# Patient Record
Sex: Female | Born: 1960 | Race: White | Hispanic: No | Smoking: Never smoker
Health system: Southern US, Community
[De-identification: ages and names within clinical notes are randomized; demographics above are authoritative.]

## PROBLEM LIST (undated history)

## (undated) DIAGNOSIS — E041 Nontoxic single thyroid nodule: Secondary | ICD-10-CM

## (undated) DIAGNOSIS — D34 Benign neoplasm of thyroid gland: Secondary | ICD-10-CM

## (undated) DIAGNOSIS — K8020 Calculus of gallbladder without cholecystitis without obstruction: Secondary | ICD-10-CM

## (undated) DIAGNOSIS — E049 Nontoxic goiter, unspecified: Secondary | ICD-10-CM

## (undated) HISTORY — PX: OSTEOTOMY MAXILLARY: SUR982

## (undated) HISTORY — PX: KNEE ARTHROSCOPY W/ MENISCECTOMY: SHX1879

---

## 2010-03-18 MED ORDER — BUPROPION SR 150 MG TAB
150 mg | ORAL_TABLET | Freq: Two times a day (BID) | ORAL | Status: DC
Start: 2010-03-18 — End: 2016-12-04

## 2010-03-18 MED ORDER — OMEPRAZOLE 20 MG TAB, DELAYED RELEASE
20 mg | ORAL_TABLET | Freq: Every day | ORAL | Status: AC
Start: 2010-03-18 — End: ?

## 2010-03-18 MED ORDER — ATENOLOL 25 MG TAB
25 mg | ORAL_TABLET | Freq: Every day | ORAL | Status: DC
Start: 2010-03-18 — End: 2010-12-16

## 2010-03-18 NOTE — Progress Notes (Signed)
Suzanne Wong is a68 y.o.  female who presents to establish care.     Subjective: Perimenopause     Seen by Gynecologist recently, complains of irritable moods and decreased sex drive. Has tried Wellbutrin for depression which helped, Zoloft did not help recently.     Subjective: HTN   Patient presents for follow up of hypertension. No previous diagnosis or treatment.    Diet and Lifestyle: generally follows a low fat low cholesterol diet, exercises sporadically  Home BP Monitoring: is not measured at home    Cardiovascular ROS: no TIA's, no chest pain on exertion, no dyspnea on exertion, no swelling of ankles.      Subjective: chest pain    States frequent, occasionally awakens from sleep. Midchest radiates through toward back.    Subjective: Back Pain   Patient presents for presents evaluation of low back problems. States worse on left lower lumbar region, occasional nodule present that can be very painful, no concern for neuro deficit/cauda equina.    Subjective: GERD   Patient complains of gastroesophageal reflux with abdominal bloating and chest pain. Symptoms have been present for several months.  She denies dysphagia.  She  has not lost weight.  She denies melena, hematochezia, hematemesis, and coffee ground emesis. Medical therapy in the past has included OTC H2 antagonists.         Review of Systems - Pertinent items are noted in HPI.    History   Social History   ??? Marital Status: Unknown     Spouse Name: N/A     Number of Children: N/A   ??? Years of Education: N/A   Occupational History   ??? Not on file.   Social History Main Topics   ??? Smoking status: Never Smoker    ??? Smokeless tobacco: Never Used   ??? Alcohol Use: 10.0 oz/week     20 drink(s) per week      20 drinks /wk   ??? Drug Use: No   ??? Sexually Active: Not on file   Other Topics Concern   ??? Blood Transfusions No   Social History Narrative   ??? No narrative on file           Objective:    BP 139/97   Pulse 77   Temp(Src) 98.1 ??F (36.7 ??C) (Oral)   Resp 12   Ht 5\' 4"  (1.626 m)   Wt 128 lb (58.06 kg)   BMI 21.97 kg/m2    Physical Examination:   General appearance - alert, well appearing, and in no distress  Mental status - alert, oriented to person, place, and time, normal mood, behavior, speech, dress, motor activity, and thought processes  Eyes - pupils equal and reactive, extraocular eye movements intact  Ears - bilateral TM's and external ear canals normal  Nose - normal and patent, no erythema, discharge or polyps  Mouth - mucous membranes moist, pharynx normal without lesions  Neck - supple, no significant adenopathy, no thyromegaly  Chest - clear to auscultation, no wheezes, rales or rhonchi, symmetric air entry  Heart - normal rate, regular rhythm, normal S1, S2, no murmurs, rubs, clicks or gallops  Abdomen - soft, nontender, nondistended, no masses or organomegaly, bowel sounds normal and present  Neurological - alert, oriented, normal speech, no focal findings or movement disorder noted, cranial nerves II through XII intact, DTR's normal and symmetric, motor and sensory grossly normal bilaterally  Musculoskeletal - no joint tenderness, deformity or swelling  Extremities - peripheral  pulses normal, no pedal edema, no clubbing or cyanosis  Skin - normal coloration and turgor, no rashes, no suspicious skin lesions noted       Assessment / Plan   Suzanne was seen today for establish care.    Diagnoses and associated orders for this visit:    Htn (hypertension)  - atenolol (TENORMIN) 25 mg tablet; Take 1 Tab by mouth daily.    Gerd (gastroesophageal reflux disease)  - Omeprazole delayed release (PRILOSEC D/R) 20 mg tablet; Take 1 Tab by mouth daily.    Perimenopause  - buPROPion SR (WELLBUTRIN SR) 150 mg SR tablet; Take 1 Tab by mouth two (2) times a day.    Chest pain  - NM CARDIAC SPECT W STRESS / REST SNGL; Future    Screening mammogram  - MAM MAMMOGRAM SCREENING BILATERAL; Future     Other Orders  - Discontinue: ranitidine hcl 150 mg capsule; Take 150 mg by mouth two (2) times a day.        Medication(s) were discussed with patient including risk/benefit and common side effects. Patient in agreement with plan.    Handout provided to patient regarding GERD, chest pain.  Follow-up Disposition:  Return in about 1 month (around 04/18/2010).    Hewitt Shorts, MD

## 2010-03-18 NOTE — Patient Instructions (Addendum)
MyChart Activation    Thank you for requesting access to MyChart. Please follow the instructions below to securely access and download your online medical record. MyChart allows you to send messages to your doctor, view your test results, renew your prescriptions, schedule appointments, and more.    How Do I Sign Up?    1. In your internet browser, go to [email or web address].  2. Click on the First Time User? Click Here link in the Sign In box. You will see the New Member Sign Up page.  3. Enter your MyChart Access Code exactly as it appears below. You will not need to use this code after you???ve completed the sign-up process. If you do not sign up before the expiration date, you must request a new code.    MyChart Access Code: [email or web address]  Expires: 06/16/10 03:13 PM     4. Enter the last four digits of your Social Security Number (xxxx) and Date of Birth (mm/dd/yyyy) as indicated and click Submit. You will be taken to the next sign-up page.  5. Create a MyChart ID. This will be your MyChart login ID and cannot be changed, so think of one that is secure and easy to remember.  6. Create a MyChart password. You can change your password at any time.  7. Enter your Password Reset Question and Answer. This can be used at a later time if you forget your password.   8. Enter your e-mail address. You will receive e-mail notification when new information is available in MyChart.  9. Click Sign Up. You can now view and download portions of your medical record.  10. Click the Download Summary menu link to download a portable copy of your medical information.    Additional Information    If you have questions, please call [phone]. Remember, MyChart is NOT to be used for urgent needs. For medical emergencies, dial 911.      English   Spanish  Chest Pain: After Your Visit to the Emergency Room  Your Care Instructions     There are many things that can cause chest pain. Some are not serious and will get better on their own in a few days. But some kinds of chest pain need more testing and treatment. Your doctor may have recommended a follow-up visit in the next 8 to 12 hours. If you are not getting better, you may need more tests or treatment.  Even though you have been released from the emergency room, you still need to watch for any problems. The doctor carefully checked you. But sometimes problems can develop later. If you have new symptoms, or if your symptoms do not get better, return to the emergency room or call your doctor right away.  If you have worse or different chest pain or pressure that lasts more than 5 minutes or you passed out (llost consciousness), call 911 or seek other emergency help right away.   A visit to the emergency room is only one step in your treatment. Even if you feel better, you still need to do what your doctor recommends, such as going to all suggested follow-up appointments and taking medicines exactly as directed. This will help you recover and help prevent future problems.  How can you care for yourself at home?  ?? Rest until you feel better.   ?? Take your medicine exactly as prescribed. Call your doctor if you think you are having a problem with your medicine.   ?? Do not drive after  taking a prescription pain medicine.  When should you call for help?  Call 911 if:  ?? You passed out (lost consciousness).   ?? You have sudden shortness of breath with chest pain, or you cough up blood.   ?? You have worse or different chest pain or pressure that lasts more than 5 minutes, along with:   ?? Sweating.   ?? Shortness of breath.   ?? Nausea or vomiting.   ?? Pain that spreads from the chest to the neck, jaw, or one or both shoulders or arms.   ?? Dizziness or lightheadedness.   ?? A fast, slow, or uneven pulse.   After calling 911, chew one adult-strength aspirin. If an ambulance is not an option, have someone drive you to the hospital. Do NOT try to drive yourself.   ?? You have other symptoms that you think are a medical emergency.  Return to the emergency room now if:  ?? You have any trouble breathing.   ?? Your chest pain gets worse.   ?? You are dizzy or lightheaded, or you feel like you may faint.  Call your doctor today if:  ?? Your chest pain does not get better.   ?? Your chest pain happens when you exercise and goes away when you rest.     Where can you learn more?   Go to [email or web address]  Enter A120 in the search box to learn more about "Chest Pain: After Your Visit to the Emergency Room."   ?? 2006-2011 [employer]. Care instructions adapted under license by [employer] (which disclaims liability or warranty for this information). This care instruction is for use with your licensed healthcare professional. If you have questions about a medical condition or this instruction, always ask your healthcare professional. [employer], [employer] disclaims any warranty or liability for your use of this information.  Content Version: 8.9.83828; Last Revised: October 13, 2010English   Spanish  Gastroesophageal Reflux Disease (GERD): After Your Visit  Your Care Instructions    Gastroesophageal reflux disease (GERD) is the backward flow of stomach acid into the esophagus. The esophagus is the tube that leads from your throat to your stomach. A one-way valve prevents the stomach acid from moving up into this tube. When you have GERD, this valve does not close tightly enough.  If you have mild GERD symptoms including heartburn, you may be able to control the problem with antacids or over-the-counter medicine. Changing your diet, losing weight, and making other lifestyle changes can also help reduce symptoms.   Follow-up care is a key part of your treatment and safety. Be sure to make and go to all appointments, and call your doctor if you are having problems. It???s also a good idea to know your test results and keep a list of the medicines you take.  How can you care for yourself at home?  ?? Take your medicines exactly as prescribed. Call your doctor if you think you are having a problem with your medicine.   ?? Do not take aspirin or other nonsteroidal anti-inflammatory drugs (NSAIDs), such as ibuprofen (Advil, Motrin) or naproxen (Aleve), unless your doctor says it is okay. Ask your doctor what you can take for pain.   ?? Your doctor may recommend over-the-counter medicine. For mild or occasional indigestion, antacids, such as Tums, Gaviscon, Mylanta, or Maalox, may help. Your doctor also may recommend over-the-counter acid reducers, such as Pepcid AC, Tagamet HB, Zantac 75, or Prilosec. Read and follow all instructions on  the label. If you use these medicines often, talk with your doctor.   ?? Do not smoke or chew tobacco. Smoking can make GERD worse. If you need help quitting, talk to your doctor about stop-smoking programs and medicines. These can increase your chances of quitting for good.   ?? Do not drink coffee or alcohol, and do not eat any foods that make you feel worse. These may include fatty foods, chocolate, citrus, tomatoes, peppermint, and spicy foods.   ?? Eat smaller meals, more often. After eating, wait 2 to 3 hours before you lie down.   ?? Do not wear tight clothing around your midsection.   ?? If you are overweight, lose weight. Even losing 5 to 10 pounds can help.   ?? Raise the head of your bed 6 to 8 inches by putting blocks under the frame or a foam wedge under the head of the mattress.  When should you call for help?  Call 911 anytime you think you may need emergency care. For example, call if:  ?? You have chest pain or pressure. This may occur with:   ?? Sweating.   ?? Shortness of breath.    ?? Nausea or vomiting.   ?? Pain that spreads from the chest to the neck, jaw, or one or both shoulders or arms.   ?? Dizziness or lightheadedness.   ?? A fast or uneven pulse.  After calling 911, chew 1 adult-strength aspirin. Wait for an ambulance. Do not try to drive yourself.   ?? You vomit blood or what looks like coffee grounds.   ?? You pass maroon or very bloody stools.  Call your doctor now or seek immediate medical care if:  ?? You have new or different belly pain.   ?? Your stools are black and tarlike or have streaks of blood.  Watch closely for changes in your health, and be sure to contact your doctor if:  ?? Your symptoms have not improved after 2 days.   ?? Food seems to catch in your throat or chest.     Where can you learn more?   Go to [email or web address]  Enter [phone] in the search box to learn more about "Gastroesophageal Reflux Disease (GERD): After Your Visit."   ?? 2006-2011 [employer]. Care instructions adapted under license by [employer] (which disclaims liability or warranty for this information). This care instruction is for use with your licensed healthcare professional. If you have questions about a medical condition or this instruction, always ask your healthcare professional. [employer], [employer] disclaims any warranty or liability for your use of this information.  Content Version: 8.9.83828; Last Revised: March 16, 2009English   Spanish  High Blood Pressure: After Your Visit  Your Care Instructions  If your blood pressure is usually above 140/90, you have high blood pressure, or hypertension. Despite what a lot of people think, high blood pressure usually does not cause headaches or make you feel dizzy or lightheaded. It usually has no symptoms. But it does increase your risk for heart attack, stroke, and kidney or eye damage. The higher your blood pressure, the more your risk increases.   Changes in your lifestyle, such as staying at a healthy weight, may help you lower your blood pressure. Your treatment also will include medicine. If you stop taking your medicine, your blood pressure will go back up.  Follow-up care is a key part of your treatment and safety. Be sure to make and go to all appointments, and  call your doctor if you are having problems. It???s also a good idea to know your test results and keep a list of the medicines you take.  How can you care for yourself at home?  Medical treatment  ?? Take your medicine exactly as prescribed. You may take one or more types of medicine to lower your blood pressure. They include diuretics, beta-blockers, ACE inhibitors, calcium channel blockers, angiotensin II receptor blockers, and other medicines. Call your doctor if you think you are having a problem with your medicine.   ?? Your doctor may suggest that you take one low-dose aspirin (81 mg) a day. This can help reduce your risk of having a stroke or heart attack.   ?? See your doctor at least 2 times a year. You may need to see the doctor more often at first or until your blood pressure comes down.   ?? If you are taking blood pressure medicine, talk to your doctor before you take decongestants or anti-inflammatory medicine, such as ibuprofen. Some of these medicines can raise blood pressure.   ?? Learn how to check your blood pressure at home.  Lifestyle changes  ?? Stay at a healthy weight. This is especially important if you put on weight around the waist. Losing even 10 pounds can help you lower your blood pressure.   ?? If your doctor recommends it, get more exercise. Walking is a good choice. Bit by bit, increase the amount you walk every day. Try for at least 30 minutes on most days of the week. You also may want to swim, bike, or do other activities.   ?? Avoid or limit alcohol. Talk to your doctor about whether you can drink any alcohol.   ?? Limit salt.    ?? Eat plenty of fruits (such as bananas and oranges), vegetables, legumes, whole grains, and low-fat dairy products.   ?? Lower the amount of saturated fat in your diet. Saturated fat is found in animal products such as milk, cheese, and meat. Limiting these foods may help you lose weight and also lower your risk for heart disease.   ?? Do not smoke. Smoking increases your risk for heart attack and stroke. If you need help quitting, talk to your doctor about stop-smoking programs and medicines. These can increase your chances of quitting for good.  When should you call for help?  Call 911 anytime you think you may need emergency care. For example, call if:  ?? You have chest pain or pressure. This may occur with:   ?? Sweating.   ?? Shortness of breath.   ?? Nausea or vomiting.   ?? Pain that spreads from the chest to the neck, jaw, or one or both shoulders or arms.   ?? Dizziness or lightheadedness.   ?? A fast or uneven pulse.  After calling 911, chew 1 adult-strength aspirin. Wait for an ambulance. Do not try to drive yourself.   ?? You have signs of a stroke. These include:   ?? Sudden numbness, paralysis, or weakness in your face, arm, or leg, especially on only one side of your body.   ?? New problems with walking or balance.   ?? Sudden vision changes.   ?? New problems speaking or understanding simple statements, or feeling confused.   ?? A sudden, severe headache that is different from past headaches.  Call your doctor now or seek immediate medical care if:  ?? Your blood pressure rises suddenly.   ?? Your blood pressure  is 180/110 or higher.   ?? You are dizzy or lightheaded, or you feel like you may faint.   ?? Your blood pressure is higher than 140/90 on two or more occasions.  Watch closely for changes in your health, and be sure to contact your doctor if:  ?? You do not get better as expected.     Where can you learn more?   Go to [email or web address]   Enter [phone] in the search box to learn more about "High Blood Pressure: After Your Visit."   ?? 2006-2011 [employer], [employer]. Care instructions adapted under license by [employer] (which disclaims liability or warranty for this information). This care instruction is for use with your licensed healthcare professional. If you have questions about a medical condition or this instruction, always ask your healthcare professional. [employer], [employer] disclaims any warranty or liability for your use of this information.  Content Version: 8.9.83828; Last Revised: Dec 24, 2007

## 2010-03-20 LAB — CBC WITH AUTOMATED DIFF
ABS. BASOPHILS: 0 10*3/uL (ref 0.0–0.1)
ABS. EOSINOPHILS: 0.2 10*3/uL (ref 0.0–0.5)
ABS. LYMPHOCYTES: 1.7 10*3/uL (ref 0.8–3.5)
ABS. MONOCYTES: 0.5 10*3/uL — ABNORMAL LOW (ref 0.8–3.5)
ABS. NEUTROPHILS: 2.1 10*3/uL (ref 1.5–8.0)
BASOPHILS: 1 % (ref 0–2)
EOSINOPHILS: 5 % (ref 0–5)
HCT: 42.2 % (ref 41–53)
HGB: 14.5 g/dL (ref 12.0–16.0)
LYMPHOCYTES: 38 % (ref 19–48)
MCH: 33.3 PG — ABNORMAL HIGH (ref 27–31)
MCHC: 34.4 g/dL (ref 31–37)
MCV: 96.8 FL (ref 80–100)
MONOCYTES: 11 % — ABNORMAL HIGH (ref 3–9)
MPV: 8.8 FL (ref 5.9–10.3)
NEUTROPHILS: 45 % (ref 40–74)
PLATELET: 254 10*3/uL (ref 130–400)
RBC: 4.36 M/uL (ref 4.2–5.4)
RDW: 12.9 % (ref 11.5–14.5)
WBC: 4.6 10*3/uL (ref 4.5–10.8)

## 2010-03-20 LAB — METABOLIC PANEL, BASIC
Anion gap: 10 mmol/L (ref 6–15)
BUN/Creatinine ratio: 11 (ref 7–25)
BUN: 9 MG/DL (ref 7–18)
CO2: 26 MMOL/L (ref 21–32)
Calcium: 9.8 MG/DL (ref 8.5–10.1)
Chloride: 101 MMOL/L (ref 98–107)
Creatinine: 0.8 MG/DL (ref 0.6–1.3)
GFR est AA: >60 mL/min/{1.73_m2} (ref >60)
GFR est non-AA: >60 mL/min/{1.73_m2} (ref >60)
Glucose: 88 MG/DL (ref 70–110)
Potassium: 4.2 MMOL/L (ref 3.5–5.1)
Sodium: 137 MMOL/L (ref 136–145)

## 2010-03-20 LAB — HEPATIC FUNCTION PANEL
A-G Ratio: 1.4 (ref 1.2–2.2)
ALT (SGPT): 30 U/L (ref 30–65)
AST (SGOT): 16 U/L (ref 15–37)
Albumin: 4.5 g/dL (ref 3.4–5.0)
Alk. phosphatase: 49 U/L — ABNORMAL LOW (ref 50–136)
Bilirubin, direct: <0.1 MG/DL (ref 0.0–0.2)
Bilirubin, total: 0.4 MG/DL (ref 0.2–1.0)
Globulin: 3.2 g/dL (ref 2.4–3.5)
Protein, total: 7.7 g/dL (ref 6.4–8.2)

## 2010-03-20 LAB — HCG QL SERUM: HCG, Ql.: NEGATIVE

## 2010-03-20 NOTE — Progress Notes (Addendum)
Form faxed to [facility] scheduling for mammogram & ST

## 2010-03-25 MED ADMIN — ondansetron (ZOFRAN) injection 4 mg: INTRAVENOUS | @ 13:00:00 | NDC 00143989105

## 2010-03-25 MED ADMIN — midazolam (VERSED) injection 2 mg: INTRAVENOUS | @ 13:00:00 | NDC 63323041112

## 2010-03-25 MED ADMIN — lactated ringers infusion: INTRAVENOUS | @ 13:00:00 | NDC 00409795309

## 2010-03-25 NOTE — Procedures (Signed)
Operative Note  EGD and Colonoscopy    Patient: Suzanne Wong               Sex: female          DOA: 03/25/2010         Date of Birth:  [birth year]      Age:  49 y.o.        LOS:  LOS: 0 days     Referring MD: Shona Simpson    Date and Time of Dictation/Note: March 25, 2010 9:20 AM    Preoperative Indications: 49 y.o. female with GERD, heartburn, nausea with vomiting and epigastric pain, Bloating, and constipation.    Preoperative Counseling: Preoperatively, the patient was counseled regarding the risks, benefits and alternatives to the procedure.   The patient expressed an understanding of the risks, benefits and alternatives.  All of their questions were answered according to their satisfaction and they agreed to undergo the procedure.   A signed informed consent was obtained and placed on the chart.    Preoperative Diagnosis: HEARTBURN, NAUSEA AND VOMITING, ACUTE GASTRITIS AND BLOATING    PREOPERATIVE DIAGNOSIS:   1) Gastroesophageal reflux disease: 530.81   2) Heartburn: 787.10   3) Bloating: 787.30   4) Constipation: 564.00   5) Epigastric Pain: 789.06     Postoperative Diagnosis:    1) Gastroesophageal reflux disease: 530.81   2) Heartburn: 787.10   3) Bloating: 787.30   4) Constipation: 564.00   5) Epigastric Pain: 789.06    6) Hiatal Hernia: 553.30   7) Gastritis: 535.00  8) Internal Hemorrhoids: 455.00   9) Diverticulosis of the Sigmoid Colon: 562.10    Procedure:   EGD with Biopsy: 43239      Colonoscopy : 81191    Surgeon: Consepcion Hearing. Adarryl Goldammer, M.D.  Assistants: None    Specimens:   ID Type Source Tests Collected by Time Destination   1 : BX antrum Preservative Gastric  Vernis Eid 03/25/2010 0903 Pathology        Estimated Blood Loss:  None    Anesthesia:  IV Sedation  Supervised by Dr. Jamison Neighbor    Preparation: Monitors used and placed by anesthesia included, but were not limited to EKG leads, Pulse Oximeter, Blood Pressure Cuff, and Oxygen.    Medications Used:: Please see the Anesthesia Record    Complications:   None      Description of Procedure:  Procedure in Detail:   She was taken to Operating Room and identfied as [place] and the procedure verified  A Time Out was held and the above information confirmed.    The patient was taken to the Endoscopy Suite and positioned on their left side.   They were connected by anesthesia to cardiac monitor and nasal cannula 02 at 3 liters per minute.  They were given IV sedation per Anesthesia.      The EGD was performed first:     They were then asked to bite on a bite block to protect their teeth and to protect the scope.  The lubricated scope was then inserted and passed easily through the esophagus, the stomach, and then into the duodenum.  The second portion of the duodenum and the duodenal bulb were carefully inspected and both appeared normal.  The scope was then withdrawn into the stomach. There was some mild gastritis seen in the lower part of the stomach in the antrum, but there were no frank ulcers or erosions seen. Mild irritation of the  gastric antrum was noted. A biopsy was taken of this area. The scope was then retroflexed to inspect the upper part of the stomach and the gastroesophageal junction from below.  There was no hiatal hernia seen.   The scope was carefully withdrawn through the GE junction and the esophagus, continuing to inspect the mucosa.   The remaining portion of the esophagus all appeared normal.   The scope was then removed.       The patient was then repositioned for the Colonoscopy:     A rectal exam was performed.  Visual inspection of the perianal area showed no abnormalities of the external anal or peri-anal area.  No significant internal or external hemorrhoids were visualized.  No masses were seen or palpated.  A finger was inserted into the rectum. Finger palpation of the rectum revealed no abnormalities.      The lubricated colonoscope was then inserted into the anus and was easily and gently advanced.  The scope was passed very  carefully through the colon.  The sigmoid colon was normally tortuous but the scope passed easily. No strictures were encountered.  The scope was then passed on over to the cecum.  Careful inspection of the colonic mucosa was performed as the scope was withdrawn.  It took over 6 minutes to inspect the colonic mucosa as fully and completely as possible. There were no polyps seen .     There was no evidence of any other abnormalities in the other portions of the cecum, the ascending colon, the transverse colon, the left colon, and in the sigmoid colon. There was diverticulosis seen  In the sigmoid colon. There was no evidence of any active bleeding anywhere throughout the colon. The rectum and the anal canal were normal except for small internal hemorrhoids. The scope was carefully removed.  Again, there were no external hemorrhoids noted.    The patient tolerated the procedure well, They were awakened and taken to the Recovery Room in good stable condition.       Condition: Stable, awake to the Recovery Room    To the Recovery Room: by nursing and anesthesia personnel.    The Family was informed of their condition and the findings by me.  Photos of the procedure were also given to the family.    Postoperative Instructions:      Prescriptions: None     Follow-up: The patient will be given an appointment to see me in 2 weeks.        _____________________________  Hilarie Fredrickson, M.D.  March 25, 2010  9:25 AM

## 2010-03-25 NOTE — Procedures (Signed)
Operative Note  EGD and Colonoscopy    Patient: Suzanne Wong               Sex: female          DOA: 03/25/2010         Date of Birth:  1961/06/04      Age:  49 y.o.        LOS:  LOS: 0 days     Referring MD: Shona Simpson    Date and Time of Dictation/Note: March 25, 2010 9:20 AM    Preoperative Indications: 49 y.o. female with GERD, heartburn, nausea with vomiting and epigastric pain, Bloating, and constipation.    Preoperative Counseling: Preoperatively, the patient was counseled regarding the risks, benefits and alternatives to the procedure.   The patient expressed an understanding of the risks, benefits and alternatives.  All of their questions were answered according to their satisfaction and they agreed to undergo the procedure.   A signed informed consent was obtained and placed on the chart.    Preoperative Diagnosis: HEARTBURN, NAUSEA AND VOMITING, ACUTE GASTRITIS AND BLOATING    PREOPERATIVE DIAGNOSIS:   1) Gastroesophageal reflux disease: 530.81   2) Heartburn: 787.10   3) Bloating: 787.30   4) Constipation: 564.00   5) Epigastric Pain: 789.06     Postoperative Diagnosis:    1) Gastroesophageal reflux disease: 530.81   2) Heartburn: 787.10   3) Bloating: 787.30   4) Constipation: 564.00   5) Epigastric Pain: 789.06    6) Hiatal Hernia: 553.30   7) Gastritis: 535.00  8) Internal Hemorrhoids: 455.00   9) Diverticulosis of the Sigmoid Colon: 562.10    Procedure:   EGD with Biopsy: 43239      Colonoscopy : 96045    Surgeon: Consepcion Hearing. Bellarae Lizer, M.D.  Assistants: None    Specimens:   ID Type Source Tests Collected by Time Destination   1 : BX antrum Preservative Gastric  Sahib Pella 03/25/2010 0903 Pathology        Estimated Blood Loss:  None    Anesthesia:  IV Sedation  Supervised by Dr. Jamison Neighbor    Preparation: Monitors used and placed by anesthesia included, but were not limited to EKG leads, Pulse Oximeter, Blood Pressure Cuff, and Oxygen.    Medications Used:: Please see the Anesthesia Record    Complications:  None       Description of Procedure:  Procedure in Detail:   She was taken to Operating Room and identfied as [place] and the procedure verified  A Time Out was held and the above information confirmed.    The patient was taken to the Endoscopy Suite and positioned on their left side.   They were connected by anesthesia to cardiac monitor and nasal cannula 02 at 3 liters per minute.  They were given IV sedation per Anesthesia.      The EGD was performed first:     They were then asked to bite on a bite block to protect their teeth and to protect the scope.  The lubricated scope was then inserted and passed easily through the esophagus, the stomach, and then into the duodenum.  The second portion of the duodenum and the duodenal bulb were carefully inspected and both appeared normal.  The scope was then withdrawn into the stomach. There was some mild gastritis seen in the lower part of the stomach in the antrum, but there were no frank ulcers or erosions seen. Mild irritation of the  gastric antrum was noted. A biopsy was taken of this area. The scope was then retroflexed to inspect the upper part of the stomach and the gastroesophageal junction from below.  There was no hiatal hernia seen.   The scope was carefully withdrawn through the GE junction and the esophagus, continuing to inspect the mucosa.   The remaining portion of the esophagus all appeared normal.   The scope was then removed.       The patient was then repositioned for the Colonoscopy:     A rectal exam was performed.  Visual inspection of the perianal area showed no abnormalities of the external anal or peri-anal area.  No significant internal or external hemorrhoids were visualized.  No masses were seen or palpated.  A finger was inserted into the rectum. Finger palpation of the rectum revealed no abnormalities.       The lubricated colonoscope was then inserted into the anus and was easily and gently advanced.  The scope was passed very carefully through the colon.  The sigmoid colon was normally tortuous but the scope passed easily. No strictures were encountered.  The scope was then passed on over to the cecum.  Careful inspection of the colonic mucosa was performed as the scope was withdrawn.  It took over 6 minutes to inspect the colonic mucosa as fully and completely as possible. There were no polyps seen .     There was no evidence of any other abnormalities in the other portions of the cecum, the ascending colon, the transverse colon, the left colon, and in the sigmoid colon. There was diverticulosis seen  In the sigmoid colon. There was no evidence of any active bleeding anywhere throughout the colon. The rectum and the anal canal were normal except for small internal hemorrhoids. The scope was carefully removed.  Again, there were no external hemorrhoids noted.    The patient tolerated the procedure well, They were awakened and taken to the Recovery Room in good stable condition.       Condition: Stable, awake to the Recovery Room    To the Recovery Room: by nursing and anesthesia personnel.    The Family was informed of their condition and the findings by me.  Photos of the procedure were also given to the family.    Postoperative Instructions:      Prescriptions: None     Follow-up: The patient will be given an appointment to see me in 2 weeks.        _____________________________  Hilarie Fredrickson, M.D.  March 25, 2010  9:25 AM

## 2010-03-25 NOTE — H&P (Signed)
Date of Surgery Update:  [place] was seen and examined.  There have been no significant clinical changes since the completion of the above History and Physical.    Signed By: Hilarie Fredrickson, MD     March 25, 2010 8:17 AM

## 2010-03-25 NOTE — Progress Notes (Signed)
Post - Anesthesia Evaluation & Assessment Note:    Name: Suzanne Wong  Age: 49 y.o.  Sex: female  DOB: Jun 05, 1961  MRN: [id number]  CSN: [id number]     Visit Vitals   Item Reading   ??? BP 117/66   ??? Pulse 67   ??? Temp 97.4 ??F (36.3 ??C)   ??? Resp 17   ??? Ht 5\' 4"  (1.626 m)   ??? Wt 128 lb (58.06 kg)   ??? BMI 21.97 kg/m2   ??? SpO2 100%         Patient is s/p (type of anesthesia): MAC    Nausea/Vomiting: no nausea and no vomiting    Post-operative hydration adequate.    Pain score (VAS): 0    Mental status & level of consciousness: alert and oriented x 3    Neurological status: moves all extremities, sensation grossly intact    Pulmonary status: airway patent, no supplemental oxygen required    Complications related to anesthesia: none    Patient has met all discharge requirements.    Additional comments:    Ezzard Standing, MD    03/25/2010 at 9:56 AM

## 2010-04-03 NOTE — Procedures (Signed)
Procedures signed by Elayne Snare at 04/05/10 1043                 Author: Elayne Snare  Service: --  Author Type: Physician       Filed: 04/05/10 1043  Date of Service: 04/03/10 0000  Status: Signed          Editor: Elayne Snare            Procedure Orders        1. STRESS TEST MYOVIEW [[id number]] ordered by Elayne Snare, MD at 04/03/10 1206                         <!--EPICS-->                           [facility]<BR> <BR> <BR> PT Name:  Suzanne, Wong           Admitted:  04/03/2010<BR> MR#:  [id number]                      DOB:  20-Dec-1960<BR> Account #:  [id number]            Age:  49<BR> Dictator:  Elayne Snare, MD      Location:<BR> <BR> <BR> Cardiology - GXT - GRADED EXERCISE TEST<BR> Reason for GXT:  Chest pain, hypertension<BR> Medications:   Wellbutrin, Tenormin, Prilosec<BR> Allergies:     No known drug allergies<BR> INTERPRETATION OF STRESS TEST:<BR> Protocol:      Stress Myoview  Age:    49Y   Height:   64&quot;  Weight:<BR> 128  Sex: F    Race:<BR> <BR> * * * EXERCISE RESPONSE * * *<BR>  <BR> BLOOD PRESSURE      HEART RATE               MET<BR> REST:     110/60              REST: 69<BR> :     110/60              99<BR> :     122/70              132<BR> 5:09 MIN:  160/84        148<BR> MIN:<BR> MIN:<BR> PEAK:                     PEAK:<BR> <BR> <BR> * * * POST EXERCISE RESPONSE * * *<BR> <BR> BLOOD PRESSURE      HEART RATE<BR> :     140/80              97<BR> :     140/80              88<BR> MIN:<BR> MIN:<BR> <BR> <BR> HEART - - PREDICTED MHR: 178  MHR ACHIEVED:   86%  PEAK HR ACHIEVED:<BR> 149<BR> TOTAL TIME:    5:09 TARGET(85% MHR):    146  STAGE:    Bruce I<BR> TIME:     5:09<BR> <BR> SYMPTOMS/REASON FOR STOPPING: No chest pain.<BR> PRE-EXERCISE ECG:   Normal sinus rhythm.  No STT changes.<BR> EKG RESPONSE:  No significant  ST segment changes noted.  No<BR> arrhythmia.<BR> PEAK ACTIVITY LEVEL:     7.1  METS.     MAXIMUM 02 CONSUMPTION(VO2):<BR>  24.85<BR> INDEX OF CARDIAC WORK:   23.68     FITNESS CLASSIFICATION:   Average<BR> <BR> INTERPRETATION:<BR>  1. Negative electrocardiographic  exercise stress test.<BR> 2. Myoview distribution report is pending.<BR> <BR> <BR> <BR> __________________________________<BR> Elayne Snare, M.D.<BR> <BR> YP:tds   DD:  04/03/2010 00:00:00  DT:  04/03/2010 20:23:08<BR> <BR> CC:<BR> <BR> <BR> <BR> <BR>  Document #:  [id number]<BR> <!--EPICE-->

## 2010-04-03 NOTE — Procedures (Signed)
[facility]      PT Name: Suzanne, Wong Admitted: 04/03/2010  MR#: [id number] DOB: [birth year]  Account #: [id number] Age: 49  Dictator: Elayne Snare, MD Location:      Cardiology - GXT - GRADED EXERCISE TEST  Reason for GXT: Chest pain, hypertension  Medications: Wellbutrin, Tenormin, Prilosec  Allergies: No known drug allergies  INTERPRETATION OF STRESS TEST:  Protocol: Stress Myoview Age: 49Y Height: 64" Weight:  128 Sex: F Race:    * * * EXERCISE RESPONSE * * *    BLOOD PRESSURE HEART RATE MET  REST: 110/60 REST: 69  : 110/60 99  : 122/70 132  5:09 MIN: 160/84 148  MIN:  MIN:  PEAK: PEAK:      * * * POST EXERCISE RESPONSE * * *    BLOOD PRESSURE HEART RATE  : 140/80 97  : 140/80 88  MIN:  MIN:      HEART - - PREDICTED MHR: 178 MHR ACHIEVED: 86% PEAK HR ACHIEVED:  149  TOTAL TIME: 5:09 TARGET(85% MHR): 146 STAGE: Bruce I  TIME: 5:09    SYMPTOMS/REASON FOR STOPPING: No chest pain.  PRE-EXERCISE ECG: Normal sinus rhythm. No STT changes.  EKG RESPONSE: No significant ST segment changes noted. No  arrhythmia.  PEAK ACTIVITY LEVEL: 7.1 METS. MAXIMUM 02 CONSUMPTION(VO2):  24.85  INDEX OF CARDIAC WORK: 23.68 FITNESS CLASSIFICATION: Average    INTERPRETATION:  1. Negative electrocardiographic exercise stress test.  2. Myoview distribution report is pending.        __________________________________  Elayne Snare, M.D.    YP:tds DD: 04/03/2010 00:00:00 DT: 04/03/2010 20:23:08    CC:          Document #: [id number]

## 2010-04-03 NOTE — Progress Notes (Signed)
STRESS MYOVIEW WAS COMPLETED 12:12 PM WITH NO COMPLICATIONS BY Collette Pescador G Lunabella Badgett.    PATIENTS RESTING IMAGE DOSE IS MYOVIEW.  PATIENTS STRESS IMAGE DOSE IS MYOVIEW.    Dr Sarina Ill WILL READ STRESS TEST.

## 2010-04-04 NOTE — Progress Notes (Signed)
Quick Note:    Noted, d/w pt at OV.  ______

## 2010-04-08 NOTE — Progress Notes (Signed)
Suzanne Wong Notch is a 49 y.o.  female who presents for follow-up of chronic medical problems.  Subjective: test results    Stress test - no interval c/o chest pain, underwent stress test last week, no current complaints, BP better controlled  Colonoscopy - per Dr. Owens Shark, benign, biopsy results pending       Review of Systems - Pertinent items are noted in HPI.    History   Social History   ??? Marital Status: Unknown     Spouse Name: N/A     Number of Children: N/A   ??? Years of Education: N/A   Occupational History   ??? Not on file.   Social History Main Topics   ??? Smoking status: Never Smoker    ??? Smokeless tobacco: Never Used   ??? Alcohol Use: 10.0 oz/week     20 drink(s) per week      20 drinks /wk, none for 2 days   ??? Drug Use: No   ??? Sexually Active: Not on file   Other Topics Concern   ??? Blood Transfusions No   Social History Narrative   ??? No narrative on file           Objective:   Physical Examination:   BP 141/85   Pulse 61   Resp 14   Ht 5\' 4"  (1.626 m)   Wt 128 lb (58.06 kg)   BMI 21.97 kg/m2   General appearance - alert, well appearing, and in no distress  Mental status - alert, oriented to person, place, and time, normal mood, behavior, speech, dress, motor activity, and thought processes  Eyes - pupils equal and reactive, extraocular eye movements intact  Ears - bilateral TM's and external ear canals normal  Nose - normal and patent, no erythema, discharge or polyps  Mouth - mucous membranes moist, pharynx normal without lesions  Neck - supple, no significant adenopathy, no thyromegaly  Chest - clear to auscultation, no wheezes, rales or rhonchi, symmetric air entry  Heart - normal rate, regular rhythm, normal S1, S2, no murmurs, rubs, clicks or gallops  Abdomen - soft, nontender, nondistended, no masses or organomegaly, bowel sounds normal and present   Neurological - alert, oriented, normal speech, no focal findings or movement disorder noted, cranial nerves II through XII intact, DTR's normal and symmetric, motor and sensory grossly normal bilaterally  Musculoskeletal - no joint tenderness, deformity or swelling  Extremities - peripheral pulses normal, no pedal edema, no clubbing or cyanosis  Skin - normal coloration and turgor, no rashes, no suspicious skin lesions noted     RECENT LABS  Lab Results   Component Value Date/Time    WBC 4.6 03/20/10 10:47 AM    HGB 14.5 03/20/10 10:47 AM    HCT 42.2 03/20/10 10:47 AM    PLATELET 254 03/20/10 10:47 AM    MCV 96.8 03/20/10 10:47 AM       Lab Results   Component Value Date/Time    Sodium 137 03/20/10 10:47 AM    Potassium 4.2 03/20/10 10:47 AM    Chloride 101 03/20/10 10:47 AM    CO2 26 03/20/10 10:47 AM    Anion gap 10 03/20/10 10:47 AM    Glucose 88 03/20/10 10:47 AM    BUN 9 03/20/10 10:47 AM    Creatinine 0.8 03/20/10 10:47 AM    BUN/Creatinine ratio 11 03/20/10 10:47 AM    GFR est AA >60 03/20/10 10:47 AM    GFR est non-AA >60 03/20/10  10:47 AM    Calcium 9.8 03/20/10 10:47 AM    Bilirubin, total 0.4 03/20/10 10:47 AM    ALT 30 03/20/10 10:47 AM    AST 16 03/20/10 10:47 AM    Alk. phosphatase 49 03/20/10 10:47 AM    Protein, total 7.7 03/20/10 10:47 AM    Albumin 4.5 03/20/10 10:47 AM    Globulin 3.2 03/20/10 10:47 AM    A-G Ratio 1.4 03/20/10 10:47 AM       EXERCISE CARDIAC STRESS TEST  IMPRESSION:     1. NORMAL LEFT SYSTOLIC FUNCTION. EF 66%.   2. NO EVIDENCE OF PREVIOUS MYOCARDIAL INFARCTION NOR REVERSIBLE ISCHEMIA.     Signing/Reading Doctor: Precious Reel [phone])  Transcribed: MYB on 04/03/2010  Approved: Precious Reel [phone]) 04/04/2010    All recent labs were reviewed in patient's presence, questions answered.    Assessment / Plan   Suzanne Wong was seen today for follow-up and results.    Diagnoses and associated orders for this visit:    Htn (hypertension)  - stress test normal, monitor ambulatory readings       Medication(s) were discussed with patient including risk/benefit and common side effects. Patient in agreement with plan.    Follow-up Disposition:  Return in about 6 months (around 10/09/2010).    Hewitt Shorts, MD

## 2010-10-07 NOTE — Progress Notes (Signed)
A user error has taken place: encounter opened in error, closed for administrative reasons.

## 2010-12-16 MED ORDER — ATENOLOL 25 MG TAB
25 mg | ORAL_TABLET | ORAL | Status: AC
Start: 2010-12-16 — End: ?

## 2016-12-04 ENCOUNTER — Emergency Department: Admit: 2016-12-05 | Payer: BLUE CROSS/BLUE SHIELD | Primary: Nurse Practitioner

## 2016-12-04 ENCOUNTER — Emergency Department: Payer: BLUE CROSS/BLUE SHIELD | Primary: Nurse Practitioner

## 2016-12-04 ENCOUNTER — Inpatient Hospital Stay
Admit: 2016-12-04 | Discharge: 2016-12-05 | Disposition: A | Discharge status: Home / Self Care | Payer: BLUE CROSS/BLUE SHIELD | Attending: Emergency Medicine

## 2016-12-04 DIAGNOSIS — S27321A Contusion of lung, unilateral, initial encounter: Secondary | ICD-10-CM

## 2016-12-04 NOTE — ED Notes (Signed)
Pt updated on status.
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
## 2016-12-04 NOTE — ED Notes (Signed)
Pt transported to CT
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
## 2016-12-04 NOTE — ED Notes (Signed)
Urine collected and sent to lab.
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
## 2016-12-04 NOTE — ED Notes (Signed)
Pt transported to CT and x-ray  

## 2016-12-05 LAB — CBC WITH AUTOMATED DIFF
ABS. BASOPHILS: 0.1 10*3/uL (ref 0.0–0.1)
ABS. EOSINOPHILS: 0.2 10*3/uL (ref 0.0–0.5)
ABS. IMM. GRANS.: 0.1 10*3/uL
ABS. LYMPHOCYTES: 2.7 10*3/uL (ref 0.8–3.5)
ABS. MONOCYTES: 0.8 10*3/uL (ref 0.8–3.5)
ABS. NEUTROPHILS: 9 10*3/uL — ABNORMAL HIGH (ref 1.5–8.0)
BASOPHILS: 1 % (ref 0–2)
EOSINOPHILS: 1 % (ref 0–5)
HCT: 42.7 % (ref 41–53)
HGB: 13.8 g/dL (ref 12.0–16.0)
IMMATURE GRANULOCYTES: 0 % — ABNORMAL LOW (ref 2–10)
LYMPHOCYTES: 21 % (ref 19–48)
MCH: 30.7 PG (ref 27–31)
MCHC: 32.3 g/dL (ref 31–37)
MCV: 94.9 FL (ref 80–100)
MONOCYTES: 6 % (ref 3–9)
MPV: 10.2 FL (ref 5.9–10.3)
NEUTROPHILS: 71 % (ref 40–74)
PLATELET: 271 10*3/uL (ref 130–400)
RBC: 4.5 M/uL (ref 4.2–5.4)
RDW: 11.9 % (ref 11.5–14.5)
WBC: 12.7 10*3/uL — ABNORMAL HIGH (ref 4.5–10.8)

## 2016-12-05 LAB — METABOLIC PANEL, BASIC
Anion gap: 8 mmol/L (ref 6–15)
BUN/Creatinine ratio: 12 (ref 7–25)
BUN: 10 MG/DL (ref 7–18)
CO2: 27 mmol/L (ref 21–32)
Calcium: 9 MG/DL (ref 8.5–10.1)
Chloride: 104 mmol/L (ref 98–107)
Creatinine: 0.85 MG/DL (ref 0.60–1.30)
GFR est AA: >60 mL/min/{1.73_m2} (ref >60)
GFR est non-AA: >60 mL/min/{1.73_m2} (ref >60)
Glucose: 91 mg/dL (ref 70–110)
Potassium: 3.7 mmol/L (ref 3.5–5.3)
Sodium: 139 mmol/L (ref 136–145)

## 2016-12-05 LAB — EKG, 12 LEAD, INITIAL
Atrial Rate: 73 {beats}/min
Calculated P Axis: 51 degrees
Calculated R Axis: 15 degrees
Calculated T Axis: 76 degrees
Diagnosis: NORMAL
P-R Interval: 118 ms
Q-T Interval: 408 ms
QRS Duration: 70 ms
QTC Calculation (Bezet): 449 ms
Ventricular Rate: 73 {beats}/min

## 2016-12-05 LAB — URINALYSIS W/ RFLX MICROSCOPIC
Bilirubin: NEGATIVE
Blood: NEGATIVE
Glucose: NEGATIVE mg/dL
Ketone: NEGATIVE mg/dL
Nitrites: NEGATIVE
Protein: NEGATIVE mg/dL
Specific gravity: 1.02 (ref 1.002–1.030)
Urobilinogen: 1 EU/dL (ref 0–1)
pH (UA): 7 (ref 4.5–8.0)

## 2016-12-05 LAB — URINE MICROSCOPIC

## 2016-12-05 LAB — EKG 12-LEAD
Atrial Rate: 73 {beats}/min
Diagnosis: NORMAL
P Axis: 51 degrees
P-R Interval: 118 ms
Q-T Interval: 408 ms
QRS Duration: 70 ms
QTc Calculation (Bazett): 449 ms
R Axis: 15 degrees
T Axis: 76 degrees
Ventricular Rate: 73 {beats}/min

## 2016-12-05 MED ORDER — ONDANSETRON (PF) 4 MG/2 ML INJECTION
4 mg/2 mL | INTRAMUSCULAR | Status: DC
Start: 2016-12-05 — End: 2016-12-05
  Administered 2016-12-05: 06:00:00 via INTRAVENOUS

## 2016-12-05 MED ORDER — MORPHINE 2 MG/ML INJECTION
2 mg/mL | INTRAMUSCULAR | Status: AC
Start: 2016-12-05 — End: 2016-12-05
  Administered 2016-12-05: 04:00:00 via INTRAVENOUS

## 2016-12-05 MED ORDER — OXYCODONE-ACETAMINOPHEN 5 MG-325 MG TAB
5-325 mg | ORAL_TABLET | ORAL | 0 refills | Status: DC | PRN
Start: 2016-12-05 — End: 2017-02-16

## 2016-12-05 MED ORDER — IOPAMIDOL 76 % IV SOLN
370 mg iodine /mL (76 %) | Freq: Once | INTRAVENOUS | Status: AC
Start: 2016-12-05 — End: 2016-12-04
  Administered 2016-12-05: 03:00:00 via INTRAVENOUS

## 2016-12-05 MED ORDER — OXYCODONE-ACETAMINOPHEN 5 MG-325 MG TAB
5-325 mg | ORAL | Status: DC | PRN
Start: 2016-12-05 — End: 2016-12-05
  Administered 2016-12-05: 16:00:00 via ORAL

## 2016-12-05 MED ORDER — ONDANSETRON (PF) 4 MG/2 ML INJECTION
4 mg/2 mL | INTRAMUSCULAR | Status: AC
Start: 2016-12-05 — End: 2016-12-05
  Administered 2016-12-05: 04:00:00 via INTRAVENOUS

## 2016-12-05 MED ORDER — OXYCODONE-ACETAMINOPHEN 5 MG-325 MG TAB
5-325 mg | ORAL_TABLET | ORAL | 0 refills | Status: DC | PRN
Start: 2016-12-05 — End: 2016-12-05

## 2016-12-05 MED ORDER — MORPHINE 2 MG/ML INJECTION
2 mg/mL | INTRAMUSCULAR | Status: DC | PRN
Start: 2016-12-05 — End: 2016-12-05
  Administered 2016-12-05: 13:00:00 via INTRAVENOUS

## 2016-12-05 MED FILL — MORPHINE 2 MG/ML INJECTION: 2 mg/mL | INTRAMUSCULAR | Qty: 3

## 2016-12-05 MED FILL — ONDANSETRON (PF) 4 MG/2 ML INJECTION: 4 mg/2 mL | INTRAMUSCULAR | Qty: 2

## 2016-12-05 MED FILL — OXYCODONE-ACETAMINOPHEN 5 MG-325 MG TAB: 5-325 mg | ORAL | Qty: 1

## 2016-12-05 MED FILL — ISOVUE-370  76 % INTRAVENOUS SOLUTION: 370 mg iodine /mL (76 %) | INTRAVENOUS | Qty: 100

## 2016-12-05 MED FILL — MORPHINE 2 MG/ML INJECTION: 2 mg/mL | INTRAMUSCULAR | Qty: 2
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
## 2016-12-05 NOTE — Progress Notes (Signed)
Chart screened for discharge planning needs per Case Management protocol.  Based on the information currently available in medical record, no intervention is required from this department at this time as there are no needs identified. Though no CM staff have been assigned to this case, CSW/CM are available to assist PRN should needs arise.
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
## 2016-12-05 NOTE — Discharge Summary (Signed)
Discharge Summary      Patient: Neya Creegan              Sex: female            MRN: [id number]      Date of Birth:  September 07, 1960      Age:  55 y.o.     Physician: Particia Jasper., MD              Admit Date: 12/04/2016    Discharge Date: 12/05/2016    Admission Diagnoses: Pulmonary contusion    Discharge Diagnoses:  same  History of Present Illness:  Katee Wentland is a 56 y.o. female with a rib fracture and lung contusion       Discharge Condition: Good    Hospital Course: observed and discharged    Discharge Instructions:    1. The patient is to be discharged home.   Discharge Medications:   same  2.   3. Diet: Regular Diet  4. Activity: Activity as tolerated  5. Follow-up: with family doctor in 1-2 weeks.

## 2016-12-05 NOTE — Progress Notes (Signed)
0600 Report received from [place] on Ms. Scaturro. Patient resting in bed with eyes closed. No apparent signs of distress noted at this time. White board updated. Call light within reach.     0620 Shift assessment done and documented in flow sheets.

## 2016-12-05 NOTE — ED Notes (Signed)
TRANSFER - OUT REPORT:    Verbal report given to [facility]) on [place]  being transferred to [facility](unit) for routine progression of care       Report consisted of patient???s Situation, Background, Assessment and   Recommendations(SBAR).     Information from the following report(s) SBAR, Kardex, ED Summary and MAR was reviewed with the receiving nurse.    Lines:   Peripheral IV 12/04/16 Left Antecubital (Active)   Site Assessment Clean, dry, & intact 12/04/2016  9:48 PM   Phlebitis Assessment 0 12/04/2016  9:48 PM   Infiltration Assessment 0 12/04/2016  9:48 PM   Dressing Status Clean, dry, & intact 12/04/2016  9:48 PM   Dressing Type Transparent 12/04/2016  9:48 PM   Hub Color/Line Status Pink 12/04/2016  9:48 PM        Opportunity for questions and clarification was provided.      Patient transported with:   [employer]

## 2016-12-05 NOTE — Progress Notes (Signed)
TRANSFER - IN REPORT:    Verbal report received from tim(name) on [place]  being received from ED(unit) for routine progression of care      Report consisted of patient???s Situation, Background, Assessment and   Recommendations(SBAR).     Information from the following report(s) SBAR, Kardex, ED Summary and Recent Results was reviewed with the receiving nurse.    Opportunity for questions and clarification was provided.      Assessment completed upon patient???s arrival to unit and care assumed.   Primary Nurse Emmit Pomfret and Dalene Seltzer, RN performed a dual skin assessment on this patient No impairment noted  Braden score is 23    Patient oriented to room.  Admission assessment done, see flowsheets.  Call light in reach, bed in low position, rails up x 2.      Patient rested well overnight,  Hourly rounds completed throughout shift.    Report will be given to dayshift nurse for continuation of care.

## 2016-12-06 LAB — CULTURE, URINE
Culture result:: 25000
Culture: 25000

## 2016-12-19 ENCOUNTER — Encounter

## 2016-12-25 ENCOUNTER — Inpatient Hospital Stay: Admit: 2016-12-25 | Payer: BLUE CROSS/BLUE SHIELD | Attending: Nurse Practitioner | Primary: Nurse Practitioner

## 2016-12-25 DIAGNOSIS — E042 Nontoxic multinodular goiter: Secondary | ICD-10-CM

## 2017-02-10 ENCOUNTER — Encounter

## 2017-02-16 ENCOUNTER — Inpatient Hospital Stay
Admit: 2017-02-16 | Payer: BLUE CROSS/BLUE SHIELD | Attending: Plastic Surgery within the Head & Neck | Primary: Nurse Practitioner

## 2017-02-16 ENCOUNTER — Encounter

## 2017-02-16 DIAGNOSIS — D34 Benign neoplasm of thyroid gland: Secondary | ICD-10-CM

## 2017-02-16 MED ORDER — LIDOCAINE HCL 1 % (10 MG/ML) IJ SOLN
10 mg/mL (1 %) | Freq: Once | INTRAMUSCULAR | Status: AC
Start: 2017-02-16 — End: 2017-02-16
  Administered 2017-02-16: 17:00:00 via INTRADERMAL

## 2017-02-16 NOTE — Procedures (Signed)
Brief Radiologist Procedure Note    Patient: Suzanne Wong MRN: [id number]  SSN: [id number]    Date of Birth: September 30, 1960  Age: 56 y.o.  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 THIS CAN BE FOUND UNDER CHART REVIEW/IMAGING RESULTS.    Anesthesia: 2% lidocaine    Assistants: None    Estimated Blood Loss: None      Specimens: None    Findings: FULL REPORT TO FOLLOW    Complications: None    Implants: None    Recommendations: Continue medical therapy.    Signed By: Everlean Alstrom, MD     February 16, 2017

## 2017-02-16 NOTE — Procedures (Addendum)
Brief Radiologist Procedure Note    Patient: Suzanne Wong MRN: [id number]  SSN: [id number]    Date of Birth: 1961-08-06  Age: 56 y.o.  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 THIS CAN BE FOUND UNDER CHART REVIEW/IMAGING RESULTS.    Anesthesia: 2% lidocaine    Assistants: None    Estimated Blood Loss: None      Specimens: None    Findings: FULL REPORT TO FOLLOW    Complications: None    Implants: None    Recommendations: Continue medical therapy.    Signed By: Everlean Alstrom, MD     February 16, 2017

## 2017-02-16 NOTE — Progress Notes (Signed)
Procedure completed in [place] Dept, biopsy site cleaned,band aid, 2by2 and Tegaderm dressing applied to Left biopsy site.  Patient received 3cc 1% Lidocaine to site.  Obtained 3 FNA spec from biopsy site.  Spec given to Pathologist, stated spec adequate for diagnosis  Patient tol procedure well.  VS as charted  Bx specimen to lab for cytology and histology  NOTE:Patient instructed to go home rest quietly, no lifting bending or exercise for the next 24 hours.  Keep head elevated for 4 hrs use extra pillows first night after biopsy  Apply covered ice pack to biopsy site 10 min's on and 10 min's off the biggest part of the day to decrease swelling to biopsy site.  Patient may resume normal diet.  Instructed to avoid aspirin, ibuprofen, Aleve, Vit E or blood-thinners for 3 days after procedure.  Patient may take Tylenol (1 or 2 tablets every 6 hours) for mild discomfort.  Keep the dressing clean and dry for 2 days. Replace with Band-Aid  If you experience any of the following symptoms :  Report any redness, warmth, drainage or temperature greater than 101* to your physician  If swelling of neck occurs with difficulty breathing noted go to ER  Call your physician for follow up appointment. The results will be sent directly to physician within 3 business working days.  Any questions post biopsy call [facility] Radiology Dept. [phone]

## 2017-02-16 NOTE — Progress Notes (Signed)
Peri-Operative Education                Taught To                Understanding   Content of Teaching                        Pt[x] Other[]             Complete [x]   Partial []  No []     PRE: US Thyroid Biopsy    POST:                                                                                                                                      Nursing Diagnosis:  1).Alteration comfort due to invasive procedure and fear of the unknown.    Expected outcome/Evaluation  Pt will experience diminished discomfort  [x]  YES    []  NO    Pt experiences comfort  Comments:     Nursing Intervention   1. Position for comfort as indicated & document    2. Reassure patient whenever necessary & document  3. Administer pain medication as ordered & document response.  4. Pain assessment/Reassessment    Nursing Diagnosis:  2.) Knowledge deficit: Need for Pre-and Post-Procedure teaching    Expected outcome/Evaluation:  Pt will verbalize basic understanding of events and procedure   [x]  YES   []   NO  Comments:    Nursing Intervention   1. Explain procedure in full, prior to implementation of procedure.  2. Provide written instructions on post-procedure follow up   (i.e. Remain NPO x 1 hr after receiving Mod Sedation, call MD for Lab results or appointments).  3. Reinforce Physician's instructions (i.e. NPO ect.).

## 2017-02-16 NOTE — Progress Notes (Signed)
RADIOLOGY:  Pre-Procedure Note      Patient: Suzanne Wong               Sex: female             MRN: [id number]      Date of Birth:  March 03, 1961      Age:  56 y.o.              Radiologist: Everlean Alstrom, MD    The patients H & P has been reviewed.    The procedure will be performed as ordered.

## 2017-03-05 ENCOUNTER — Encounter

## 2017-08-25 ENCOUNTER — Inpatient Hospital Stay: Payer: BLUE CROSS/BLUE SHIELD | Attending: Plastic Surgery within the Head & Neck | Primary: Nurse Practitioner

## 2019-01-24 ENCOUNTER — Emergency Department (HOSPITAL_COMMUNITY): Payer: BC Managed Care – PPO

## 2019-01-24 ENCOUNTER — Emergency Department (HOSPITAL_COMMUNITY)
Admission: EM | Admit: 2019-01-24 | Discharge: 2019-01-24 | Disposition: A | Discharge status: Home / Self Care | Payer: BC Managed Care – PPO | Attending: Emergency Medicine | Admitting: Emergency Medicine

## 2019-01-24 ENCOUNTER — Encounter (HOSPITAL_COMMUNITY): Payer: Self-pay | Admitting: Emergency Medicine

## 2019-01-24 ENCOUNTER — Other Ambulatory Visit: Payer: Self-pay

## 2019-01-24 DIAGNOSIS — R509 Fever, unspecified: Secondary | ICD-10-CM | POA: Insufficient documentation

## 2019-01-24 DIAGNOSIS — R0981 Nasal congestion: Secondary | ICD-10-CM

## 2019-01-24 DIAGNOSIS — Z20828 Contact with and (suspected) exposure to other viral communicable diseases: Secondary | ICD-10-CM | POA: Insufficient documentation

## 2019-01-24 DIAGNOSIS — R1010 Upper abdominal pain, unspecified: Secondary | ICD-10-CM | POA: Diagnosis not present

## 2019-01-24 HISTORY — DX: Calculus of gallbladder without cholecystitis without obstruction: K80.20

## 2019-01-24 LAB — RAPID URINE DRUG SCREEN, HOSP PERFORMED
Amphetamines: NOT DETECTED
Barbiturates: NOT DETECTED
Benzodiazepines: NOT DETECTED
Cocaine: NOT DETECTED
Opiates: NOT DETECTED
Tetrahydrocannabinol: NOT DETECTED

## 2019-01-24 LAB — CBC WITH DIFFERENTIAL/PLATELET
Abs Immature Granulocytes: 0.02 10*3/uL (ref 0.00–0.07)
Basophils Absolute: 0 10*3/uL (ref 0.0–0.1)
Basophils Relative: 1 %
Eosinophils Absolute: 0 10*3/uL (ref 0.0–0.5)
Eosinophils Relative: 0 %
HCT: 44.7 % (ref 36.0–46.0)
Hemoglobin: 14.8 g/dL (ref 12.0–15.0)
Immature Granulocytes: 1 %
Lymphocytes Relative: 11 %
Lymphs Abs: 0.3 10*3/uL — ABNORMAL LOW (ref 0.7–4.0)
MCH: 30.5 pg (ref 26.0–34.0)
MCHC: 33.1 g/dL (ref 30.0–36.0)
MCV: 92.2 fL (ref 80.0–100.0)
Monocytes Absolute: 0.3 10*3/uL (ref 0.1–1.0)
Monocytes Relative: 9 %
Neutro Abs: 2.5 10*3/uL (ref 1.7–7.7)
Neutrophils Relative %: 78 %
Platelets: 175 10*3/uL (ref 150–400)
RBC: 4.85 MIL/uL (ref 3.87–5.11)
RDW: 12.8 % (ref 11.5–15.5)
WBC: 3.2 10*3/uL — ABNORMAL LOW (ref 4.0–10.5)
nRBC: 0 % (ref 0.0–0.2)

## 2019-01-24 LAB — URINALYSIS, ROUTINE W REFLEX MICROSCOPIC
Bacteria, UA: NONE SEEN
Bilirubin Urine: NEGATIVE
Glucose, UA: NEGATIVE mg/dL
Ketones, ur: 5 mg/dL — AB
Leukocytes,Ua: NEGATIVE
Nitrite: NEGATIVE
Protein, ur: NEGATIVE mg/dL
Specific Gravity, Urine: >1.046 — ABNORMAL HIGH (ref 1.005–1.030)
pH: 5 (ref 5.0–8.0)

## 2019-01-24 LAB — COMPREHENSIVE METABOLIC PANEL
ALT: 47 U/L — ABNORMAL HIGH (ref 0–44)
AST: 60 U/L — ABNORMAL HIGH (ref 15–41)
Albumin: 3.9 g/dL (ref 3.5–5.0)
Alkaline Phosphatase: 76 U/L (ref 38–126)
Anion gap: 13 (ref 5–15)
BUN: 12 mg/dL (ref 6–20)
CO2: 23 mmol/L (ref 22–32)
Calcium: 9 mg/dL (ref 8.9–10.3)
Chloride: 99 mmol/L (ref 98–111)
Creatinine, Ser: 0.86 mg/dL (ref 0.44–1.00)
GFR calc Af Amer: >60 mL/min (ref >60)
GFR calc non Af Amer: >60 mL/min (ref >60)
Glucose, Bld: 106 mg/dL — ABNORMAL HIGH (ref 70–99)
Potassium: 3.7 mmol/L (ref 3.5–5.1)
Sodium: 135 mmol/L (ref 135–145)
Total Bilirubin: 1 mg/dL (ref 0.3–1.2)
Total Protein: 7.6 g/dL (ref 6.5–8.1)

## 2019-01-24 LAB — LACTIC ACID, PLASMA
Lactic Acid, Venous: 0.7 mmol/L (ref 0.5–1.9)
Lactic Acid, Venous: 0.9 mmol/L (ref 0.5–1.9)

## 2019-01-24 LAB — LIPASE, BLOOD: Lipase: 38 U/L (ref 11–51)

## 2019-01-24 LAB — TROPONIN I: Troponin I: <0.03 ng/mL (ref <0.03)

## 2019-01-24 LAB — SARS CORONAVIRUS 2 BY RT PCR (HOSPITAL ORDER, PERFORMED IN ~~LOC~~ HOSPITAL LAB): SARS Coronavirus 2: NEGATIVE

## 2019-01-24 MED ORDER — SODIUM CHLORIDE 0.9 % IV BOLUS
1000.0000 mL | Freq: Once | INTRAVENOUS | Status: AC
  Administered 2019-01-24: 1000 mL via INTRAVENOUS

## 2019-01-24 MED ORDER — IOHEXOL 300 MG/ML  SOLN
100.0000 mL | Freq: Once | INTRAMUSCULAR | Status: AC | PRN
  Administered 2019-01-24: 100 mL via INTRAVENOUS

## 2019-01-24 MED ORDER — ACETAMINOPHEN 325 MG PO TABS
650.0000 mg | ORAL_TABLET | Freq: Once | ORAL | Status: AC
  Administered 2019-01-24: 650 mg via ORAL
  Filled 2019-01-24: qty 2

## 2019-01-24 NOTE — Discharge Instructions (Signed)
Take your usual prescriptions as previously directed. Your liver function tests were mildly elevated today: you will need these rechecked by your regular medical doctor this week. Avoid tylenol and alcohol (and products that contain those ingredients), until you are seen in follow up. Take over the counter ibuprofen, as directed on packaging, as needed for discomfort or fever. Take over the counter nasal spray decongestant, as well as antihistamine, as directed on packaging, for the next week. Your CT scan showed an incidental finding(s):  "Markedly atrophic right kidney with a sizable cystic mass with calcified wall arising from the upper pole of the atrophic right kidney.  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 Compensatory hypertrophy of the left adrenal. Left adrenal appears unremarkable."  Your regular medical doctor can follow up this finding.  Call your regular medical doctor today to schedule a follow up appointment within the next 3 days.  Return to the Emergency Department immediately sooner if worsening.

## 2019-01-24 NOTE — ED Triage Notes (Signed)
Pt reports just not feeling well since Saturday with generalized body aches, sinus congestion, and some abdominal pain. Abd pain is chronic with hx of gallstones which pt has had surgical consult for. States her main complaint today is fatigue and sinus congestion.

## 2019-01-24 NOTE — ED Provider Notes (Signed)
[facility] EMERGENCY DEPARTMENT Provider Note   CSN: [id number] Arrival date & time: 01/24/19  7048    History   Chief Complaint Chief Complaint  Patient presents with   Fever    HPI Courtney Mcdonald is a 58 y.o. female.     HPI  Pt was seen at 0830. Per pt, c/o gradual onset and persistence of constant upper abd "pains" for the past 2 days. Pt states the abd pain began several hours after she ate [place] food. Pt states she also has been generally fatigued, and her sinuses and ears have been congested. Endorses home temps to "100." States she has hx of upper abd pain, dx gallstones several years ago, but has not been back to [facility] for cholecystectomy. Denies rash, no injury, no N/V/D, no back pain, no CP/palpitations, no SOB/cough, no sore throat, no focal motor weakness, no tingling/numbness in extremities, no black or blood in stools.      Past Medical History:  Diagnosis Date   Cholelithiasis     There are no active problems to display for this patient.   Past Surgical History:  Procedure Laterality Date   KNEE ARTHROSCOPY W/ MENISCECTOMY     OSTEOTOMY MAXILLARY       OB History   No obstetric history on file.      Home Medications    Prior to Admission medications   Not on File    Family History History reviewed. No pertinent family history.  Social History Social History   Tobacco Use   Smoking status: Never Smoker   Smokeless tobacco: Never Used  Substance Use Topics   Alcohol use: Yes    Comment: occasional   Drug use: Never     Allergies   Codeine and Dextromethorphan   Review of Systems Review of Systems ROS: Statement: All systems negative except as marked or noted in the HPI; Constitutional: Negative for chills. +generalized fatigue.; ; Eyes: Negative for eye pain, redness and discharge. ; ; ENMT: Negative for ear pain, hoarseness, sore throat. +nasal congestion, sinus pressure and ears congestion. ; ; Cardiovascular:  Negative for chest pain, palpitations, diaphoresis, dyspnea and peripheral edema. ; ; Respiratory: Negative for cough, wheezing and stridor. ; ; Gastrointestinal: +abd pain. Negative for nausea, vomiting, diarrhea, blood in stool, hematemesis, jaundice and rectal bleeding. . ; ; Genitourinary: Negative for dysuria, flank pain and hematuria. ; ; Musculoskeletal: Negative for back pain and neck pain. Negative for swelling and trauma.; ; Skin: Negative for pruritus, rash, abrasions, blisters, bruising and skin lesion.; ; Neuro: Negative for headache, lightheadedness and neck stiffness. Negative for weakness, altered level of consciousness, altered mental status, extremity weakness, paresthesias, involuntary movement, seizure and syncope.       Physical Exam Updated Vital Signs BP (!) 137/97 (BP Location: Right Arm)    Pulse 100    Temp (!) 100.5 F (38.1 C) (Oral)    Resp 20    Ht '5\' 7"'  (1.702 m)    Wt 78 kg    SpO2 96%    BMI 26.94 kg/m    Patient Vitals for the past 24 hrs:  BP Temp Temp src Pulse Resp SpO2 Height Weight  01/24/19 1234 132/85 (!) 100.6 F (38.1 C) Oral 99 (!) 22 98 % -- --  01/24/19 0900 131/87 -- -- 100 (!) 22 95 % -- --  01/24/19 0826 (!) 137/97 (!) 100.5 F (38.1 C) Oral 100 20 96 % -- --  01/24/19 0823 -- -- -- -- -- --  '5\' 7"'  (1.702 m) 78 kg     08:55 Orthostatic Vital Signs DF  Orthostatic Lying   BP- Lying: 127/81  Pulse- Lying: 101      Orthostatic Sitting  BP- Sitting: 127/81  Pulse- Sitting: 105      Orthostatic Standing at 0 minutes  BP- Standing at 0 minutes: 123/84  Pulse- Standing at 0 minutes: 107     Physical Exam 0835: Physical examination:  Nursing notes reviewed; Vital signs and O2 SAT reviewed;  Constitutional: Well developed, Well nourished, Well hydrated, In no acute distress; Head:  Normocephalic, atraumatic; Eyes: EOMI, PERRL, No scleral icterus; ENMT: Clear fluid behind TM's bilat. +edemetous nasal turbinates bilat with clear  rhinorrhea.  Mouth and pharynx without lesions. No tonsillar exudates. No intra-oral edema. No submandibular or sublingual edema. No hoarse voice, no drooling, no stridor. No pain with manipulation of larynx. No trismus..   Mouth and pharynx normal, Mucous membranes moist; Neck: Supple, Full range of motion, No lymphadenopathy; Cardiovascular: Regular rate and rhythm, No gallop; Respiratory: Breath sounds clear & equal bilaterally, No wheezes.  Speaking full sentences with ease, Normal respiratory effort/excursion; Chest: Nontender, Movement normal; Abdomen: Soft, +mild LUQ, mid-epigastric, RUQ tenderness to palp. No rebound or guarding. Nondistended, Normal bowel sounds; Genitourinary: No CVA tenderness; Extremities: Peripheral pulses normal, No tenderness, No edema, No calf edema or asymmetry.; Neuro: AA&Ox3, Major CN grossly intact.  Speech clear. No gross focal motor or sensory deficits in extremities.; Skin: Color normal, Warm, Dry.     ED Treatments / Results  Labs (all labs ordered are listed, but only abnormal results are displayed)   EKG EKG Interpretation  Date/Time:  Monday January 24 2019 08:55:54 EDT Ventricular Rate:  102 PR Interval:    QRS Duration: 78 QT Interval:  301 QTC Calculation: 392 R Axis:   43 Text Interpretation:  Sinus tachycardia Baseline wander No old tracing to compare Confirmed by Francine Graven [phone]) on 01/24/2019 9:03:27 AM   Radiology   Procedures Procedures (including critical care time)  Medications Ordered in ED Medications - No data to display   Initial Impression / Assessment and Plan / ED Course  I have reviewed the triage vital signs and the nursing notes.  Pertinent labs & imaging results that were available during my care of the patient were reviewed by me and considered in my medical decision making (see chart for details).     MDM Reviewed: previous chart, nursing note and vitals Reviewed previous: labs and ECG Interpretation:  labs, ECG, x-ray and CT scan   Results for orders placed or performed during the hospital encounter of 01/24/19  SARS Coronavirus 2 (CEPHEID - Performed in [place] hospital lab), [facility] Order  Result Value Ref Range   SARS Coronavirus 2 NEGATIVE NEGATIVE  Culture, blood (routine x 2)  Result Value Ref Range   Specimen Description RIGHT ANTECUBITAL    Special Requests      BOTTLES DRAWN AEROBIC AND ANAEROBIC Blood Culture adequate volume Performed at [facility], [address], [place], [place] [address]    Culture PENDING    Report Status PENDING   Culture, blood (routine x 2)  Result Value Ref Range   Specimen Description LEFT ANTECUBITAL    Special Requests      BOTTLES DRAWN AEROBIC AND ANAEROBIC Blood Culture adequate volume Performed at [facility], [address] [place], [place] [address]    Culture PENDING    Report Status PENDING   Comprehensive metabolic panel  Result  Value Ref Range   Sodium 135 135 - 145 mmol/L   Potassium 3.7 3.5 - 5.1 mmol/L   Chloride 99 98 - 111 mmol/L   CO2 23 22 - 32 mmol/L   Glucose, Bld 106 (H) 70 - 99 mg/dL   BUN 12 6 - 20 mg/dL   Creatinine, Ser 0.86 0.44 - 1.00 mg/dL   Calcium 9.0 8.9 - 10.3 mg/dL   Total Protein 7.6 6.5 - 8.1 g/dL   Albumin 3.9 3.5 - 5.0 g/dL   AST 60 (H) 15 - 41 U/L   ALT 47 (H) 0 - 44 U/L   Alkaline Phosphatase 76 38 - 126 U/L   Total Bilirubin 1.0 0.3 - 1.2 mg/dL   GFR calc non Af Amer >60 >60 mL/min   GFR calc Af Amer >60 >60 mL/min   Anion gap 13 5 - 15  Troponin I - Once  Result Value Ref Range   Troponin I <0.03 <0.03 ng/mL  Lactic acid, plasma  Result Value Ref Range   Lactic Acid, Venous 0.7 0.5 - 1.9 mmol/L  Lactic acid, plasma  Result Value Ref Range   Lactic Acid, Venous 0.9 0.5 - 1.9 mmol/L  CBC with Differential  Result Value Ref Range   WBC 3.2 (L) 4.0 - 10.5 K/uL   RBC 4.85 3.87 - 5.11 MIL/uL   Hemoglobin 14.8 12.0 - 15.0 g/dL   HCT 44.7 36.0 - 46.0 %   MCV 92.2 80.0 - 100.0 fL    MCH 30.5 26.0 - 34.0 pg   MCHC 33.1 30.0 - 36.0 g/dL   RDW 12.8 11.5 - 15.5 %   Platelets 175 150 - 400 K/uL   nRBC 0.0 0.0 - 0.2 %   Neutrophils Relative % 78 %   Neutro Abs 2.5 1.7 - 7.7 K/uL   Lymphocytes Relative 11 %   Lymphs Abs 0.3 (L) 0.7 - 4.0 K/uL   Monocytes Relative 9 %   Monocytes Absolute 0.3 0.1 - 1.0 K/uL   Eosinophils Relative 0 %   Eosinophils Absolute 0.0 0.0 - 0.5 K/uL   Basophils Relative 1 %   Basophils Absolute 0.0 0.0 - 0.1 K/uL   Immature Granulocytes 1 %   Abs Immature Granulocytes 0.02 0.00 - 0.07 K/uL  Urinalysis, Routine w reflex microscopic  Result Value Ref Range   Color, Urine YELLOW YELLOW   APPearance CLEAR CLEAR   Specific Gravity, Urine >1.046 (H) 1.005 - 1.030   pH 5.0 5.0 - 8.0   Glucose, UA NEGATIVE NEGATIVE mg/dL   Hgb urine dipstick MODERATE (A) NEGATIVE   Bilirubin Urine NEGATIVE NEGATIVE   Ketones, ur 5 (A) NEGATIVE mg/dL   Protein, ur NEGATIVE NEGATIVE mg/dL   Nitrite NEGATIVE NEGATIVE   Leukocytes,Ua NEGATIVE NEGATIVE   RBC / HPF 0-5 0 - 5 RBC/hpf   WBC, UA 0-5 0 - 5 WBC/hpf   Bacteria, UA NONE SEEN NONE SEEN   Squamous Epithelial / LPF 0-5 0 - 5   Mucus PRESENT   Urine rapid drug screen (hosp performed)  Result Value Ref Range   Opiates NONE DETECTED NONE DETECTED   Cocaine NONE DETECTED NONE DETECTED   Benzodiazepines NONE DETECTED NONE DETECTED   Amphetamines NONE DETECTED NONE DETECTED   Tetrahydrocannabinol NONE DETECTED NONE DETECTED   Barbiturates NONE DETECTED NONE DETECTED  Lipase, blood  Result Value Ref Range   Lipase 38 11 - 51 U/L   Ct Abdomen Pelvis W Contrast Result Date: 01/24/2019 CLINICAL DATA:  Abdominal  pain with mild fever EXAM: CT ABDOMEN AND PELVIS WITH CONTRAST TECHNIQUE: Multidetector CT imaging of the abdomen and pelvis was performed using the standard protocol following bolus administration of intravenous contrast. CONTRAST:  149m OMNIPAQUE IOHEXOL 300 MG/ML  SOLN COMPARISON:  None. FINDINGS: Lower  chest: There is mild bibasilar atelectasis. There is no lung base edema or consolidation evident. Hepatobiliary: No focal liver lesions are appreciable. Gallbladder wall is not appreciably thickened. No gallstones are evident by CT. There is no biliary duct dilatation. Pancreas: No pancreatic mass or inflammatory focus. Spleen: There are multiple tiny calcified splenic granulomas. Spleen otherwise appears normal. A small accessory spleen is noted medially. Adrenals/Urinary Tract: Adrenals bilaterally appear normal. The right kidney is markedly atrophic. There is a rim calcified cystic mass arising from the superior aspect of the atrophic right kidney measuring 4.2 x 4.2 cm. The left kidney shows evidence of compensatory hypertrophy. There is no left renal mass or hydronephrosis. No evident renal calculus on either side. Note that intravenous contrast could obscure small calculi on the left. No ureteral calculus evident. Urinary bladder is midline with wall thickness within normal limits. Stomach/Bowel: There is no appreciable bowel wall or mesenteric thickening. No evident bowel obstruction. No free air or portal venous air. Terminal ileum appears unremarkable. Vascular/Lymphatic: There is no abdominal aortic aneurysm. No vascular lesions are evident. There is no appreciable adenopathy in the abdomen or pelvis. Reproductive: The uterus is anteverted. There is no evident pelvic mass. Other: There is no periappendiceal region inflammatory focus. Appendix appears normal. Note that the appendix tracks along the right upper quadrant with its tip immediately adjacent to the inferior aspect of the gallbladder. There is no abscess or ascites in the abdomen or pelvis. Musculoskeletal: There are no blastic or lytic bone lesions. There is no intramuscular or abdominal wall lesion evident. IMPRESSION: 1. Markedly atrophic right kidney with a sizable cystic mass with calcified wall arising from the upper pole of the atrophic  right kidney. 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 Compensatory hypertrophy of the left adrenal. Left adrenal appears unremarkable. 2. No demonstrable bowel obstruction. No abscess in the abdomen or pelvis. Appendix appears normal. 3.  Several calcified splenic granulomas noted in the spleen. Electronically Signed   By: WLowella GripIII M.D.   On: 01/24/2019 11:16    Dg Chest Port 1 View Result Date: 01/24/2019 CLINICAL DATA:  Fever EXAM: PORTABLE CHEST 1 VIEW COMPARISON:  None. FINDINGS: Normal heart size. Normal mediastinal contour. No pneumothorax. No pleural effusion. Lungs appear clear, with no acute consolidative airspace disease and no pulmonary edema. IMPRESSION: No active disease. Electronically Signed   By: JIlona SorrelM.D.   On: 01/24/2019 09:02    Care Everywhere Result Report Comprehensive Metabolic PanelResulted: 49/3/11214:36 AM [facility] Component Name Value Ref Range  Glucose 104 (H) 65 - 99 mg/dL  BUN 15 6 - 24 mg/dL  Creatinine, Serum 0.88 0.57 - 1 mg/dL  eGFR If NonAfrican American 75 >59 mL/min/1.73  eGFR If African American 86 >59 mL/min/1.73  BUN/Creatinine Ratio 17 9 - 23   Sodium 139 134 - 144 mmol/L  Potassium 4.0 3.5 - 5.2 mmol/L  Chloride 99 96 - 106 mmol/L  CO2 26 18 - 29 mmol/L  CALCIUM 9.5 8.7 - 10.2 mg/dL  Total Protein 7.0 6 - 8.5 g/dL  Albumin, Serum 4.3 3.5 - 5.5 g/dL  Globulin, Total 2.7 1.5 - 4.5 g/dL  Albumin/Globulin Ratio 1.6 1.2 - 2.2  Total Bilirubin 1.2 0 - 1.2 mg/dL  Alkaline Phosphatase 90 39 - 117 IU/L  AST 106 (H) 0 - 40 IU/L  ALT (SGPT) 53 (H) 0 - 32 IU/L  Specimen Collected on  Blood - Entire vein (body structure) 11/18/2016 12:51 PM  Result Narrative  Performed at: [facility] [address], [place], [place] [id number] Lab Director: Lindon Romp MD, Phone: [phone]     1300:  LFT's chronically elevated. Lactic acid x2 negative, WBC count not elevated, no hypotension while in  the ED:  [facility] and UC have been obtained. No clear indication for admission at this time.  Tx symptomatically, f/u PMD. Dx and testing, as well as incidental finding(s), d/w pt.  Questions answered.  Verb understanding, agreeable to d/c home with outpt f/u.        Final Clinical Impressions(s) / ED Diagnoses   Final diagnoses:  Fever    ED Discharge Orders    None       Francine Graven, DO 01/28/19 1652

## 2019-01-26 LAB — URINE CULTURE: Culture: 30000 — AB

## 2019-01-29 LAB — CULTURE, BLOOD (ROUTINE X 2)
Culture: NO GROWTH
Culture: NO GROWTH
Special Requests: ADEQUATE
Special Requests: ADEQUATE

## 2020-12-09 IMAGING — CT CT ABDOMEN AND PELVIS WITH CONTRAST
2 of 5 series · 15 of 46 positions shown, 17 images · IV contrast (omnipaque)
Comparison: None.

CLINICAL DATA: Abdominal pain with mild fever

EXAM:
CT ABDOMEN AND PELVIS WITH CONTRAST
TECHNIQUE: Multidetector CT imaging of the abdomen and pelvis was performed
using the standard protocol following bolus administration of
intravenous contrast.
CONTRAST:  100mL OMNIPAQUE IOHEXOL 300 MG/ML  SOLN

[Series 2: axial st · axial · 0.69mm/px · z∈[+1014,+1424]mm · 12 of 98 slices shown, 14 images]
[im 8/98  soft-tissue]
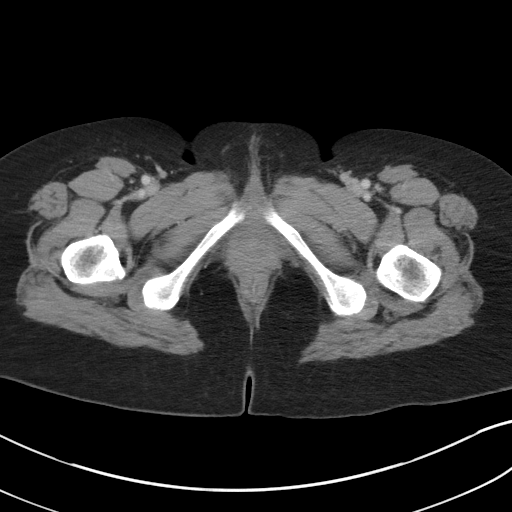
[im 8/98  bone]
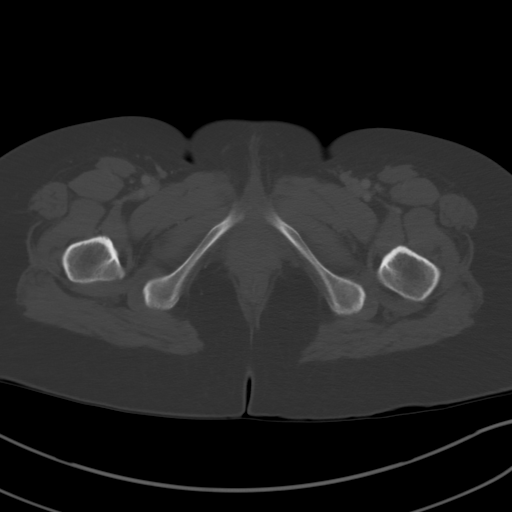
[im 15/98  soft-tissue]
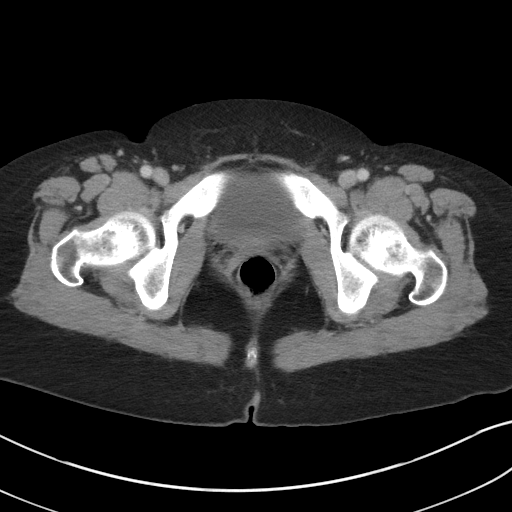
[im 23/98  soft-tissue]
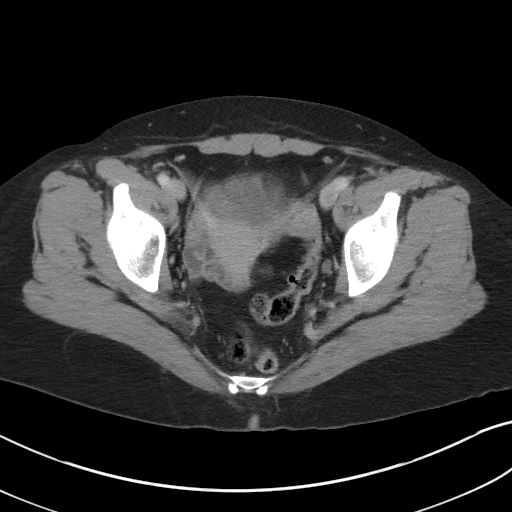
[im 30/98  soft-tissue]
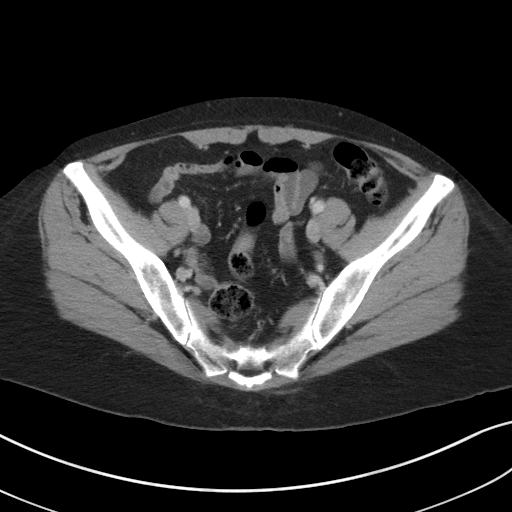
[im 38/98  soft-tissue]
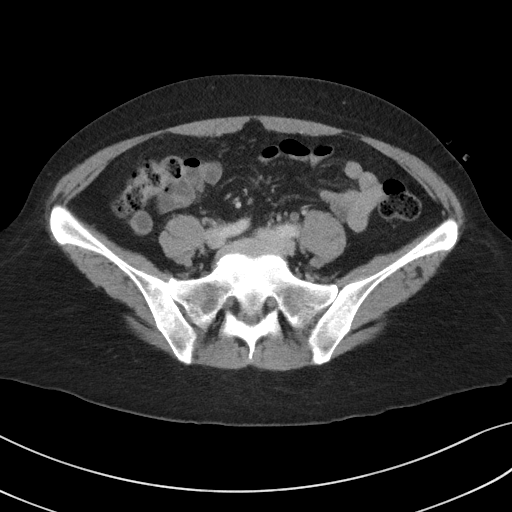
[im 45/98  soft-tissue]
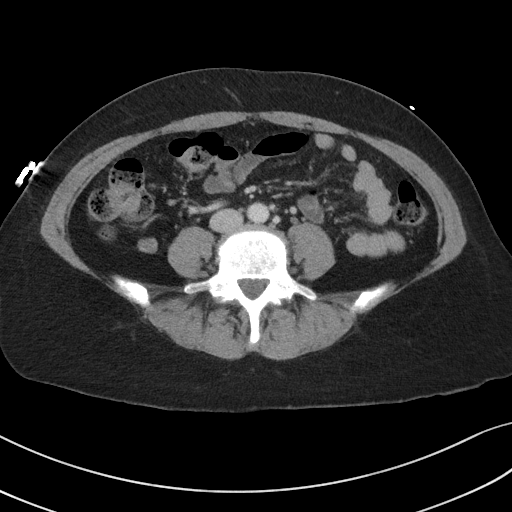
[im 53/98  soft-tissue]
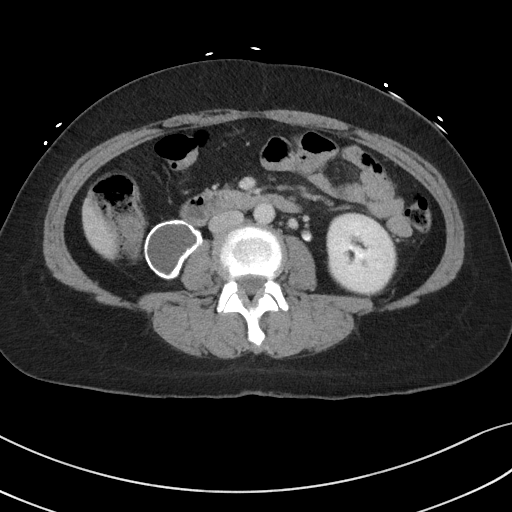
[im 60/98  soft-tissue]
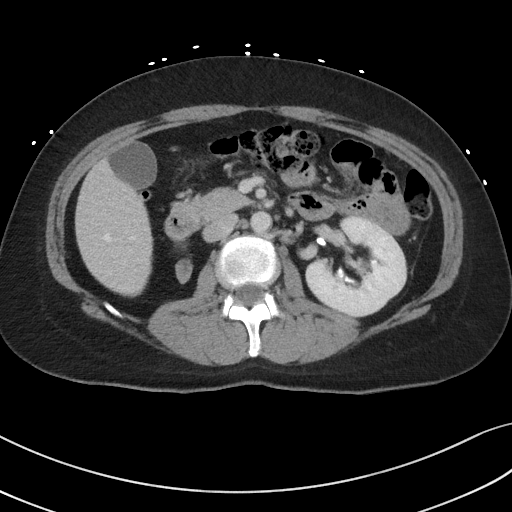
[im 68/98  soft-tissue]
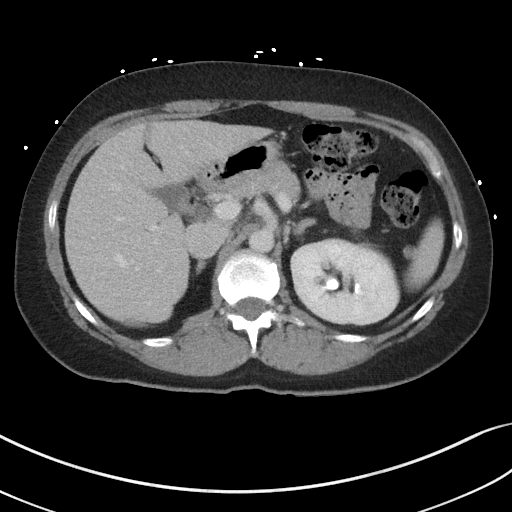
[im 68/98  bone]
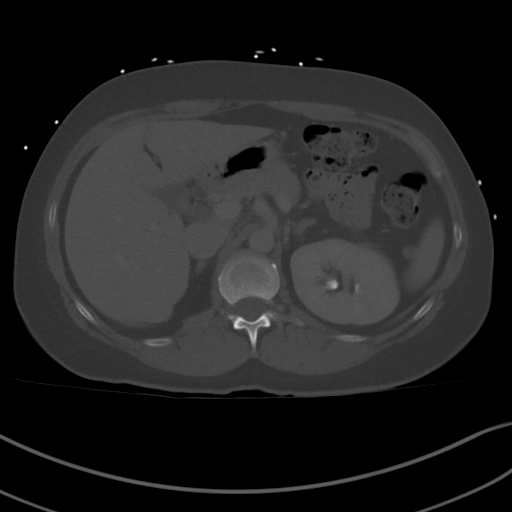
[im 75/98  soft-tissue]
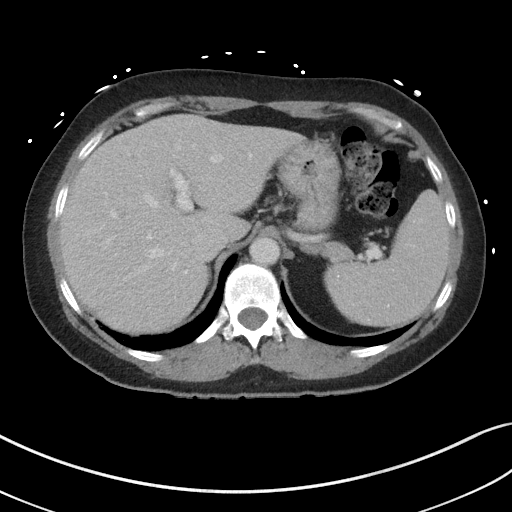
[im 83/98  soft-tissue]
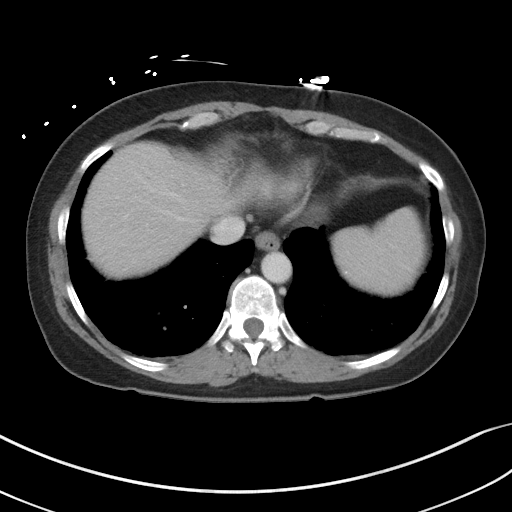
[im 90/98  soft-tissue]
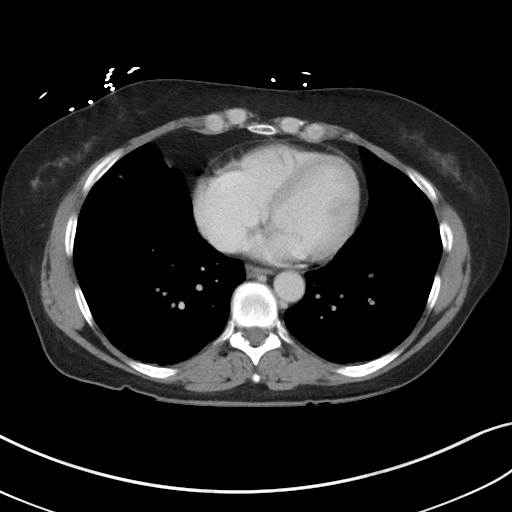

[Series 6: coronal st · coronal · 0.85mm/px · 3 of 88 slices shown]
[im 30/88  soft-tissue]
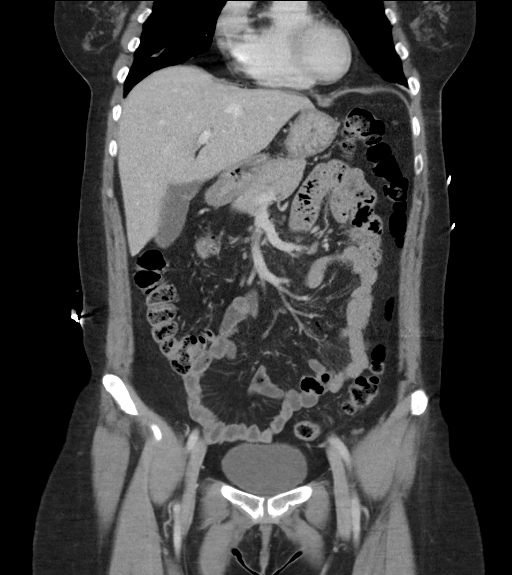
[im 39/88  soft-tissue]
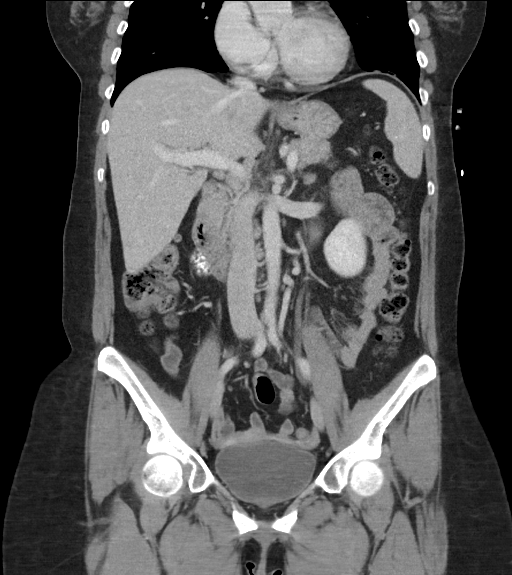
[im 49/88  soft-tissue]
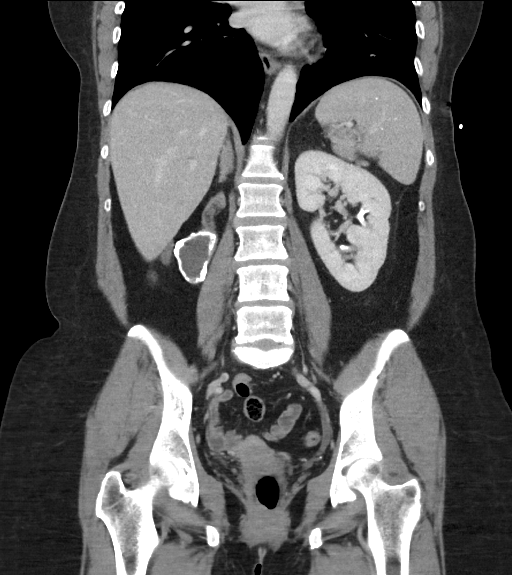

[15 of 46 positions shown; findings below may reference images not displayed]

FINDINGS: Lower chest: There is mild bibasilar atelectasis. There is no lung
base edema or consolidation evident.

Hepatobiliary: No focal liver lesions are appreciable. Gallbladder
wall is not appreciably thickened. No gallstones are evident by CT.
There is no biliary duct dilatation.

Pancreas: No pancreatic mass or inflammatory focus.

Spleen: There are multiple tiny calcified splenic granulomas. Spleen
otherwise appears normal. A small accessory spleen is noted
medially.

Adrenals/Urinary Tract: Adrenals bilaterally appear normal. The
right kidney is markedly atrophic. There is a rim calcified cystic
mass arising from the superior aspect of the atrophic right kidney
measuring 4.2 x 4.2 cm. The left kidney shows evidence of
compensatory hypertrophy. There is no left renal mass or
hydronephrosis. No evident renal calculus on either side. Note that
intravenous contrast could obscure small calculi on the left. No
ureteral calculus evident. Urinary bladder is midline with wall
thickness within normal limits.

Stomach/Bowel: There is no appreciable bowel wall or mesenteric
thickening. No evident bowel obstruction. No free air or portal
venous air. Terminal ileum appears unremarkable.

Vascular/Lymphatic: There is no abdominal aortic aneurysm. No
vascular lesions are evident. There is no appreciable adenopathy in
the abdomen or pelvis.

Reproductive: The uterus is anteverted. There is no evident pelvic
mass.

Other: There is no periappendiceal region inflammatory focus.
Appendix appears normal. Note that the appendix tracks along the
right upper quadrant with its tip immediately adjacent to the
inferior aspect of the gallbladder. There is no abscess or ascites
in the abdomen or pelvis.

Musculoskeletal: There are no blastic or lytic bone lesions. There
is no intramuscular or abdominal wall lesion evident.
IMPRESSION: 1. Markedly atrophic right kidney with a sizable cystic mass with
calcified wall arising from the upper pole of the atrophic right
kidney. Suspect residua of multicystic dysplastic kidney on the
right. Compensatory hypertrophy of the left adrenal. Left adrenal
appears unremarkable.

2. No demonstrable bowel obstruction. No abscess in the abdomen or
pelvis. Appendix appears normal.

3.  Several calcified splenic granulomas noted in the spleen.
